# Patient Record
Sex: Male | Born: 1980 | Race: White | Hispanic: No | Marital: Married | Smoking: Never smoker
Health system: Southern US, Community
[De-identification: ages and names within clinical notes are randomized; demographics above are authoritative.]

## PROBLEM LIST (undated history)

## (undated) DIAGNOSIS — K219 Gastro-esophageal reflux disease without esophagitis: Secondary | ICD-10-CM

## (undated) DIAGNOSIS — J302 Other seasonal allergic rhinitis: Secondary | ICD-10-CM

## (undated) DIAGNOSIS — F329 Major depressive disorder, single episode, unspecified: Secondary | ICD-10-CM

## (undated) DIAGNOSIS — F32A Depression, unspecified: Secondary | ICD-10-CM

---
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
## 2008-10-17 ENCOUNTER — Ambulatory Visit: Payer: Self-pay | Admitting: Family Medicine

## 2008-10-17 DIAGNOSIS — R062 Wheezing: Secondary | ICD-10-CM

## 2008-10-17 DIAGNOSIS — D239 Other benign neoplasm of skin, unspecified: Secondary | ICD-10-CM | POA: Insufficient documentation

## 2008-10-22 ENCOUNTER — Encounter: Payer: Self-pay | Admitting: Family Medicine

## 2008-10-23 LAB — CONVERTED CEMR LAB
ALT: 21 units/L (ref 0–53)
AST: 21 units/L (ref 0–37)
Albumin: 4.6 g/dL (ref 3.5–5.2)
Alkaline Phosphatase: 58 units/L (ref 39–117)
BUN: 11 mg/dL (ref 6–23)
CO2: 26 meq/L (ref 19–32)
Calcium: 9.8 mg/dL (ref 8.4–10.5)
Chloride: 105 meq/L (ref 96–112)
Cholesterol: 185 mg/dL (ref 0–200)
Creatinine, Ser: 1.12 mg/dL (ref 0.40–1.50)
Glucose, Bld: 97 mg/dL (ref 70–99)
HDL: 36 mg/dL — ABNORMAL LOW (ref >39)
LDL Cholesterol: 128 mg/dL — ABNORMAL HIGH (ref 0–99)
Potassium: 4 meq/L (ref 3.5–5.3)
Sodium: 141 meq/L (ref 135–145)
Total Bilirubin: 0.5 mg/dL (ref 0.3–1.2)
Total CHOL/HDL Ratio: 5.1
Total Protein: 7.4 g/dL (ref 6.0–8.3)
Triglycerides: 105 mg/dL (ref <150)
VLDL: 21 mg/dL (ref 0–40)

## 2008-11-21 ENCOUNTER — Ambulatory Visit: Payer: Self-pay | Admitting: Family Medicine

## 2008-11-21 DIAGNOSIS — E785 Hyperlipidemia, unspecified: Secondary | ICD-10-CM

## 2009-05-14 ENCOUNTER — Encounter: Payer: Self-pay | Admitting: Family Medicine

## 2009-06-18 ENCOUNTER — Ambulatory Visit: Payer: Self-pay | Admitting: Family Medicine

## 2009-06-18 DIAGNOSIS — F329 Major depressive disorder, single episode, unspecified: Secondary | ICD-10-CM

## 2009-07-30 ENCOUNTER — Ambulatory Visit: Payer: Self-pay | Admitting: Family Medicine

## 2009-08-23 ENCOUNTER — Telehealth: Payer: Self-pay | Admitting: Family Medicine

## 2009-09-06 ENCOUNTER — Encounter: Payer: Self-pay | Admitting: Family Medicine

## 2010-07-15 NOTE — Progress Notes (Signed)
Summary: FYI  Phone Note Call from Patient   Caller: Patient Summary of Call: FYI- Pt is to start outpatient program at [facility]. Physc has already increased meds. Pt will cancel next apt w/ you and f/u after his progrma is complete. Initial call taken by: Payton Spark CMA,  August 23, 2009 3:24 PM  Follow-up for Phone Call        OK.   Follow-up by: Seymour Bars DO,  August 23, 2009 3:29 PM

## 2010-07-15 NOTE — Letter (Signed)
Summary: Roselie Skinner MSW [facility]  Roselie Skinner MSW LCSW [facility]   Imported By: Lanelle Bal 09/19/2009 12:08:20  _____________________________________________________________________  External Attachment:    Type:   Image     Comment:   External Document

## 2010-07-15 NOTE — Assessment & Plan Note (Signed)
Summary: depression   Vital Signs:  Patient profile:   30 year old male Height:      70 inches Weight:      193 pounds BMI:     27.79 O2 Sat:      97 % on Room air Temp:     98.0 degrees F oral Pulse rate:   84 / minute BP sitting:   131 / 75  (left arm) Cuff size:   regular  Vitals Entered By: Payton Spark CMA (June 18, 2009 3:50 PM)  O2 Flow:  Room air CC: ? Depression    Primary Care Provider:  Seymour Bars DO  CC:  ? Depression .  History of Present Illness: 30 yo WM presents for problems with stress for the past 6 months.  Wants to sleep more.  He has been under a lot of Work stress, financial stress, marrital stress over the past 6 mos.  They recently bought a house and now has to sell it.  He has anhedonia, poor energy, poor sleep.  Some suicidal ideations but no plan.  No known fam hx of personal hx of depression.  Has gained 17 lbs.  Had high cholesterol on Fire [employer] physical.  + fam hx of high cholesterol.  Not taking anything.    Current Medications (verified): 1)  Zyrtec Allergy 10 Mg Caps (Cetirizine Hcl) .... Take 1 Cap Daily As Needed  Allergies (verified): No Known Drug Allergies  Past History:  Past Medical History: Reviewed history from 11/21/2008 and no changes required. low HDL  Past Surgical History: Reviewed history from 10/17/2008 and no changes required. wisdom teeth removed  Social History: Reviewed history from 10/17/2008 and no changes required. 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 No kids. Exercises regularly.  In school at [employer]. Never smoked. 2 ETOH/ wk. 6 caffeine/ day.  Review of Systems Psych:  Complains of anxiety, depression, easily tearful, and irritability; denies alternate hallucination ( auditory/visual), panic attacks, suicidal thoughts/plans, thoughts of violence, unusual visions or sounds, and thoughts /plans of harming others.  Physical Exam  General:  alert,  well-developed, well-nourished, and well-hydrated.   Skin:  color normal.   Psych:  flat affect.     Impression & Recommendations:  Problem # 1:  DEPRESSION, INITIAL EPISODE (ICD-311)  PHQ-9 done.  Scored an 11 c/w moderate depression. Seeing a Teacher, music starting today. Has a good support system.  Agrees to start generic Zoloft daily.  Call if any worsening in mood. F/U in 6 wks for f/u with repeat PHQ-9.    Her updated medication list for this problem includes:    Sertraline Hcl 50 Mg Tabs (Sertraline hcl) .Marland Kitchen... 1/2 tab by mouth daily x 1 wk then 1 tab by mouth daily  Complete Medication List: 1)  Zyrtec Allergy 10 Mg Caps (Cetirizine hcl) .... Take 1 cap daily as needed 2)  Sertraline Hcl 50 Mg Tabs (Sertraline hcl) .... 1/2 tab by mouth daily x 1 wk then 1 tab by mouth daily 3)  Triamcinolone Acetonide 0.1 % Oint (Triamcinolone acetonide) .... Apply to rash two times a day x 10 days  Patient Instructions: 1)  Take Sertraline once daily for your mood. 2)  Continue regular counseling sessions. 3)  Call me with any problems. 4)  Return for f/u mood in 6 wks. Prescriptions: TRIAMCINOLONE ACETONIDE 0.1 % OINT (TRIAMCINOLONE ACETONIDE) apply to rash two times a day x 10 days  #15 g x 0  Entered and Authorized by:   Seymour Bars DO   Signed by:   Seymour Bars DO on 06/18/2009   Method used:   Electronically to        [facility] [phone]* (retail)       [address] [place], [place]  [address]       Ph: [phone] or [phone]       Fax: [phone]   RxID:   [id number] SERTRALINE HCL 50 MG TABS (SERTRALINE HCL) 1/2 tab by mouth daily x 1 wk then 1 tab by mouth daily  #30 x 1   Entered and Authorized by:   Seymour Bars DO   Signed by:   Seymour Bars DO on 06/18/2009   Method used:   [facility]ically to        [facility] [phone]* (retail)       [address] [place], [place]  [address]       Ph: [phone] or [phone]       Fax: [phone]    RxID:   [id number]   Appended Document: depression Append to phys exam: pink patch of plaque - like psoriasis on the lower back.  Will treat with RX steroid ointment x 10 days followed by use of Aquaphor.  Seymour Bars, D.O.

## 2010-07-15 NOTE — Letter (Signed)
Summary: Depression Questionnaire/[place] [facility]  Depression Questionnaire/[place] [facility]   Imported By: Lanelle Bal 08/02/2009 09:28:37  _____________________________________________________________________  External Attachment:    Type:   Image     Comment:   External Document

## 2010-07-15 NOTE — Assessment & Plan Note (Signed)
Summary: f/u mood   Vital Signs:  Patient profile:   30 year old male Height:      70 inches Weight:      184 pounds BMI:     26.50 O2 Sat:      99 % on Room air Temp:     97.7 degrees F oral Pulse rate:   61 / minute BP sitting:   141 / 78  (right arm) Cuff size:   regular  Vitals Entered By: Payton Spark CMA/april (July 30, 2009 4:00 PM)  O2 Flow:  Room air CC: F/U   Primary Care Provider:  Seymour Bars DO  CC:  F/U.  History of Present Illness: 30 yo WM presents for f/u depression.  He was started on Zoloft 6 wks ago.  He is up to 50 mg / day.  He notices small improvements.  Having vivid dreams.  Denies adverse SE.  His wife has has been supportive.  He is working on some of his life stressors.  Seeing a Veterinary surgeon.  He is still having fatigue, poor appetite, wt loss and anhedonia.  Denies anxiety, insomnia  or panic attacks.    Allergies: No Known Drug Allergies  Past History:  Past Medical History: low HDL depression  Social History: Reviewed history from 10/17/2008 and no changes required. 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  In school at [employer]. Never smoked. 2 ETOH/ wk. 6 caffeine/ day.  Review of Systems      See HPI  Physical Exam  General:  alert, well-developed, well-nourished, and well-hydrated.   Skin:  color normal.   Psych:  good eye contact, not anxious appearing, and depressed affect.     Impression & Recommendations:  Problem # 1:  DEPRESSION, INITIAL EPISODE (ICD-311) PHQ-9 improved from 11 to 10.  I will increase his Sertraline from 50 mg to 100 mg daily.  He is to continue counseling, relaxation and exercise. We discussed an intensive program for severe depression -- he is to call me if he thinks his depression is worsening. If not seeing much improvements in 2 wks, will add Wellbutrin.   His updated medication list for this problem includes:    Sertraline  Hcl 100 Mg Tabs (Sertraline hcl) .Marland Kitchen... 1 tab by mouth qpm  Complete Medication List: 1)  Zyrtec Allergy 10 Mg Caps (Cetirizine hcl) .... Take 1 cap daily as needed 2)  Sertraline Hcl 100 Mg Tabs (Sertraline hcl) .Marland Kitchen.. 1 tab by mouth qpm 3)  Triamcinolone Acetonide 0.1 % Oint (Triamcinolone acetonide) .... Apply to rash two times a day x 10 days  Patient Instructions: 1)  Increase Sertraline to 100 mg once daily (in the evening). 2)  Work on getting regular exercise, relaxation and counseling. 3)  Call if not seeing great improvements in 2 wks, will add Wellbutrin. 4)  Call me for any worsening in mood. 5)  F/U in 1 month. Prescriptions: SERTRALINE HCL 100 MG TABS (SERTRALINE HCL) 1 tab by mouth qPM  #30 x 2   Entered and Authorized by:   Seymour Bars DO   Signed by:   Seymour Bars DO on 07/30/2009   Method used:   Electronically to        [facility] [phone]* (retail)       [address] [place], [place]  [address]       Ph: [phone] or [phone]  Fax: [phone]   RxID:   [id number]   Laboratory Results

## 2011-06-28 ENCOUNTER — Emergency Department
Admission: EM | Admit: 2011-06-28 | Discharge: 2011-06-28 | Disposition: A | Discharge status: Home / Self Care | Payer: 59 | Source: Home / Self Care | Attending: Family Medicine | Admitting: Family Medicine

## 2011-06-28 DIAGNOSIS — J069 Acute upper respiratory infection, unspecified: Secondary | ICD-10-CM

## 2011-06-28 DIAGNOSIS — L989 Disorder of the skin and subcutaneous tissue, unspecified: Secondary | ICD-10-CM

## 2011-06-28 DIAGNOSIS — J029 Acute pharyngitis, unspecified: Secondary | ICD-10-CM

## 2011-06-28 HISTORY — DX: Major depressive disorder, single episode, unspecified: F32.9

## 2011-06-28 HISTORY — DX: Depression, unspecified: F32.A

## 2011-06-28 LAB — POCT RAPID STREP A (OFFICE): Rapid Strep A Screen: NEGATIVE

## 2011-06-28 MED ORDER — AMOXICILLIN 875 MG PO TABS: 875.0000 mg | ORAL_TABLET | Freq: Two times a day (BID) | ORAL | Status: AC

## 2011-06-28 MED ORDER — BENZONATATE 200 MG PO CAPS: 200.0000 mg | ORAL_CAPSULE | Freq: Every day | ORAL | Status: AC

## 2011-06-28 NOTE — ED Provider Notes (Signed)
History     CSN: [id number]  Arrival date & time 06/28/11  1246   First MD Initiated Contact with Patient 06/28/11 1609      Chief Complaint  Patient presents with  . Cough  . Sore Throat      HPI Comments: Patient complains of approximately 5 day history of gradually progressive URI symptoms beginning with a mild sore throat (now improved), followed by progressive nasal congestion.  A cough started about 3 days ago.  Complains of fatigue but no myalgias.  Cough is now worse at night and generally non-productive during the day.  There has been no pleuritic pain, shortness of breath, or wheezes.  He also complains of a nodule that has been on his upper back for about a year, slowly growing but nontender.  Patient is a 31 y.o. male presenting with cough and pharyngitis. The history is provided by the patient.  Cough This is a new problem. Episode onset: 5 days ago. The problem occurs hourly. The problem has been gradually worsening. The cough is productive of sputum. There has been no fever. Associated symptoms include rhinorrhea and sore throat. Pertinent negatives include no chest pain, no chills, no sweats, no ear congestion, no ear pain, no headaches, no myalgias, no shortness of breath, no wheezing and no eye redness. He has tried cough syrup for the symptoms. The treatment provided no relief. He is not a smoker.  Sore Throat Pertinent negatives include no chest pain, no headaches and no shortness of breath.    Past Medical History  Diagnosis Date  . Depression     History reviewed. No pertinent past surgical history.  History reviewed. No pertinent family history.  History  Substance Use Topics  . Smoking status: Never Smoker   . Smokeless tobacco: Not on file  . Alcohol Use: No      Review of Systems  Constitutional: Negative for chills.  HENT: Positive for sore throat and rhinorrhea. Negative for ear pain.   Eyes: Negative for redness.  Respiratory: Positive for  cough. Negative for shortness of breath and wheezing.   Cardiovascular: Negative for chest pain.  Musculoskeletal: Negative for myalgias.  Neurological: Negative for headaches.   + sore throat + cough No pleuritic pain No wheezing + nasal congestion + post-nasal drainage No sinus pain/pressure No itchy/red eyes No earache No hemoptysis No SOB No fever/chills No nausea No vomiting No abdominal pain No diarrhea No urinary symptoms No skin rashes + fatigue No myalgias No headache Used OTC meds without relief  Allergies  Review of patient's allergies indicates no known allergies.  Home Medications   Current Outpatient Rx  Name Route Sig Dispense Refill  . BUPROPION HCL 100 MG PO TABS Oral Take 100 mg by mouth 2 (two) times daily.    . VENLAFAXINE HCL 25 MG PO TABS Oral Take 25 mg by mouth 2 (two) times daily.    . AMOXICILLIN 875 MG PO TABS Oral Take 1 tablet (875 mg total) by mouth 2 (two) times daily. (Rx void after 07/06/11) 14 tablet 0  . BENZONATATE 200 MG PO CAPS Oral Take 1 capsule (200 mg total) by mouth at bedtime. Take as needed for cough 12 capsule 0    BP 116/84  Pulse 108  Temp(Src) 98.3 F (36.8 C) (Oral)  Resp 18  Ht 5\' 10"  (1.778 m)  Wt 191 lb (86.637 kg)  BMI 27.41 kg/m2  SpO2 97%  Physical Exam  Skin:  On the left upper back is a dome shaped oval firm raised lesion with rounded edges, measuring about 4mm by 8mm.  No tenderness.  Not fluctuant.   Nursing notes and Vital Signs reviewed. Appearance:  Patient appears healthy, stated age, and in no acute distress Eyes:  Pupils are equal, round, and reactive to light and accomodation.  Extraocular movement is intact.  Conjunctivae are not inflamed  Ears:  Canals normal.  Tympanic membranes normal.  Nose:  Mildly congested turbinates.  No sinus tenderness.   Pharynx:  Normal Neck:  Supple.  Slightly tender shotty anterior/posterior nodes are palpated bilaterally  Lungs:  Clear to  auscultation.  Breath sounds are equal.  Heart:  Regular rate and rhythm without murmurs, rubs, or gallops.  Abdomen:  Nontender without masses or hepatosplenomegaly.  Bowel sounds are present.  No CVA or flank tenderness.  Extremities:  No edema.  No calf tenderness   ED Course  Procedures none   Labs Reviewed  POCT RAPID STREP A (OFFICE) negative      1. Acute upper respiratory infections of unspecified site   2. Acute pharyngitis   3. Skin lesion of back, uncertain etiology      MDM  There is no evidence of bacterial infection today.   Treat symptomatically for now  Take Mucinex D (guaifenesin with decongestant) twice daily for congestion.  Increase fluid intake, rest. May use Afrin nasal spray (or generic oxymetazoline) twice daily for about 5 days.  Also recommend using saline nasal spray several times daily and saline nasal irrigation. Stop all antihistamines for now, and other non-prescription cough/cold preparations. Begin Amoxicillin if not improving about 5 days or if persistent fever develops (given Rx to hold) Follow-up with family doctor if not improving 7 to 10 days. Follow-up with family doctor, dermatologist, or plastic surgeon for biopsy of lesion on back         Donna Christen, MD 06/28/11 1657

## 2011-06-28 NOTE — ED Notes (Signed)
States symptoms started Tuesday and wife request since they had to wait so long we can look at raised area to left side of upper back which has been there for over a year.  Area appears to be pus filled and redden

## 2017-12-24 ENCOUNTER — Other Ambulatory Visit: Payer: Self-pay | Admitting: Nurse Practitioner

## 2017-12-24 ENCOUNTER — Ambulatory Visit
Admission: RE | Admit: 2017-12-24 | Discharge: 2017-12-24 | Disposition: A | Discharge status: Home / Self Care | Payer: No Typology Code available for payment source | Source: Ambulatory Visit | Attending: Nurse Practitioner | Admitting: Nurse Practitioner

## 2017-12-24 DIAGNOSIS — Z021 Encounter for pre-employment examination: Secondary | ICD-10-CM

## 2020-02-01 IMAGING — CR DG CHEST 1V
1 series · 1 of 1 positions shown · non-contrast
Comparison: None.

CLINICAL DATA: Pre-employment physical examination

EXAM:
CHEST  1 VIEW

[w chest pa]
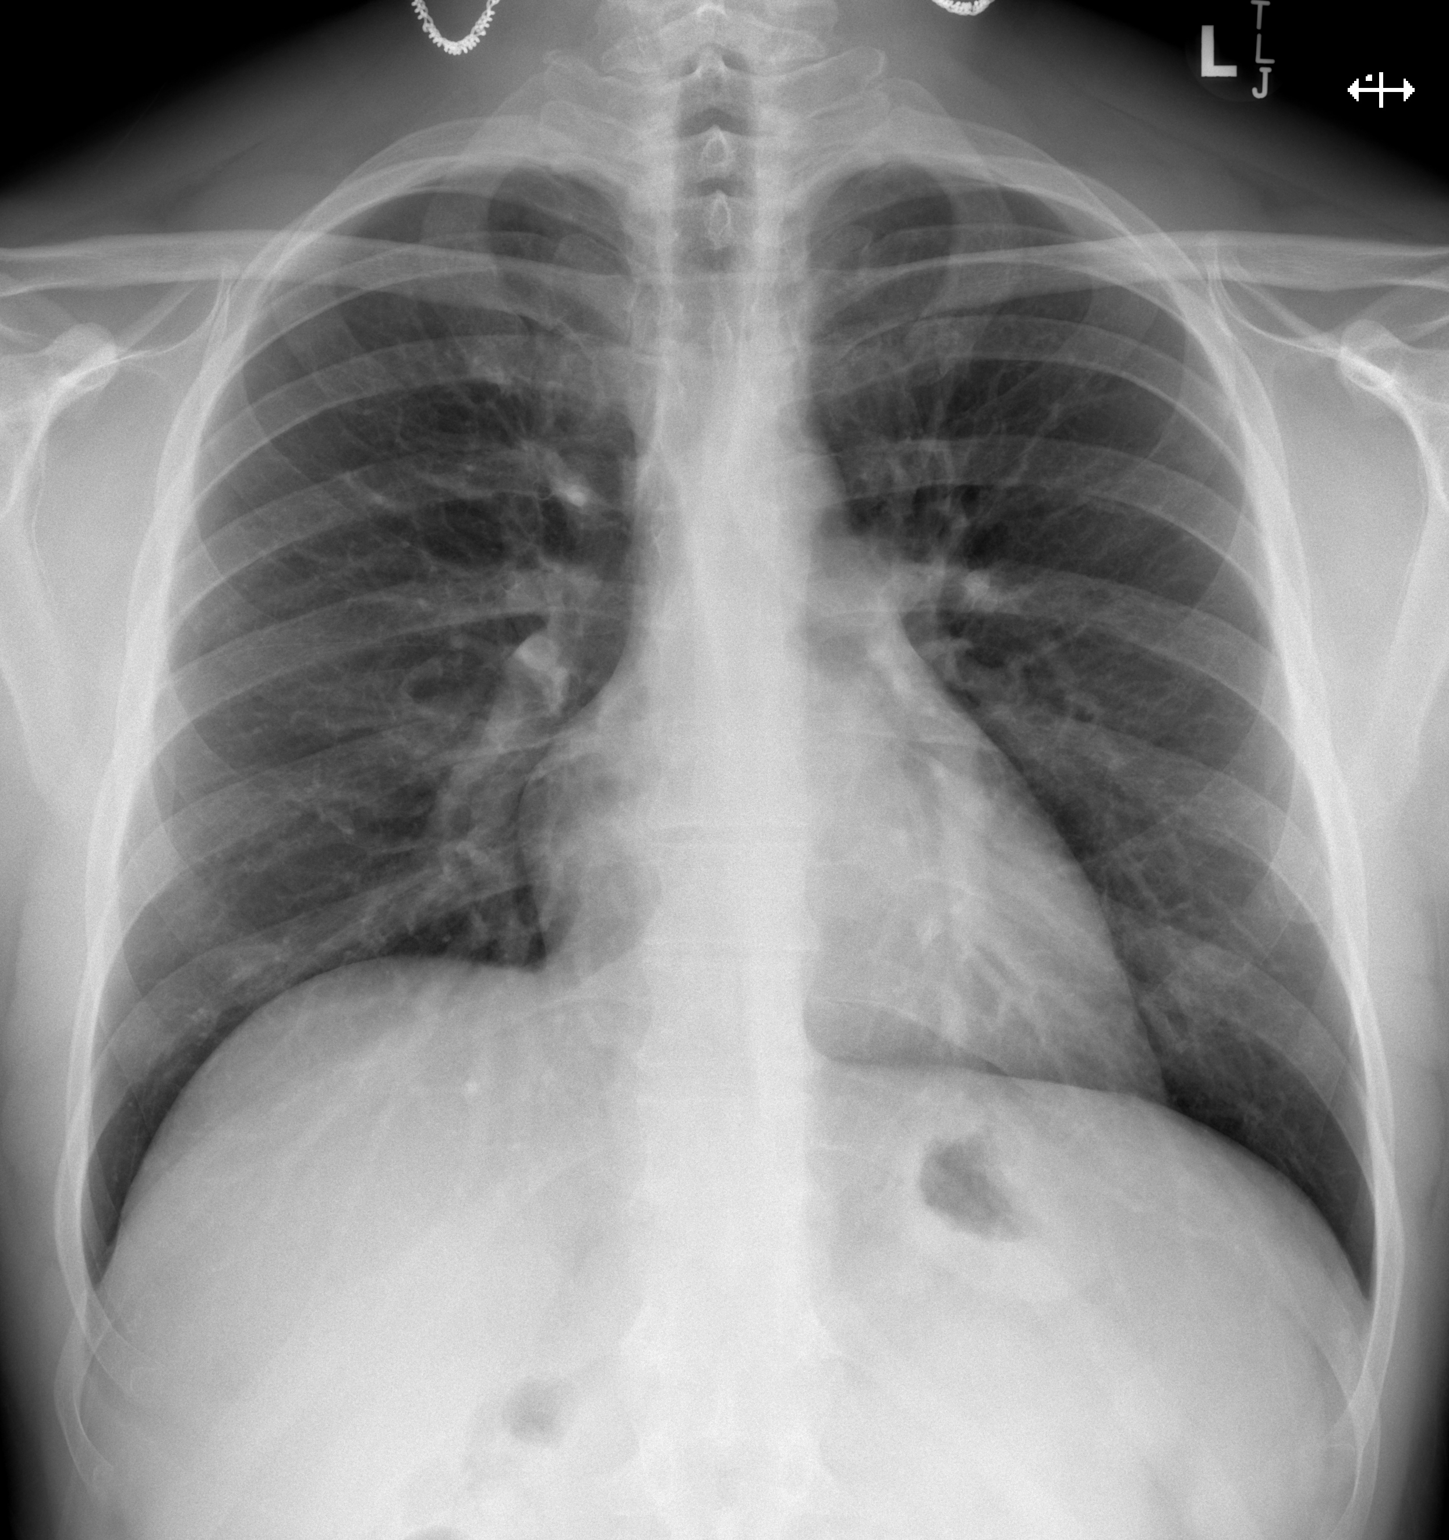

[1 of 1 positions shown; findings below may reference images not displayed]

FINDINGS: Lungs are clear. Heart size and pulmonary vascularity are normal. No
adenopathy. No bone lesions.
IMPRESSION: No abnormality noted.

## 2020-10-16 ENCOUNTER — Other Ambulatory Visit: Payer: Self-pay

## 2020-10-16 ENCOUNTER — Emergency Department
Admission: EM | Admit: 2020-10-16 | Discharge: 2020-10-16 | Disposition: A | Discharge status: Home / Self Care | Payer: 59 | Source: Home / Self Care

## 2020-10-16 ENCOUNTER — Encounter: Payer: Self-pay | Admitting: *Deleted

## 2020-10-16 DIAGNOSIS — M25562 Pain in left knee: Secondary | ICD-10-CM | POA: Diagnosis not present

## 2020-10-16 DIAGNOSIS — S8392XA Sprain of unspecified site of left knee, initial encounter: Secondary | ICD-10-CM

## 2020-10-16 HISTORY — DX: Gastro-esophageal reflux disease without esophagitis: K21.9

## 2020-10-16 HISTORY — DX: Other seasonal allergic rhinitis: J30.2

## 2020-10-16 MED ORDER — MELOXICAM 15 MG PO TABS: 15.0000 mg | ORAL_TABLET | Freq: Every day | ORAL | 0 refills | Status: AC

## 2020-10-16 NOTE — ED Provider Notes (Signed)
[facility]   [id number] 10/16/20 Arrival Time: 1540  GQ:QPYPP PAIN  SUBJECTIVE: History from: patient. Timothy Berger is a 40 y.o. male complains of L knee pain that began about 1 week ago. States that he has been taking tylenol with little relief. Denies a precipitating event or specific injury. Localizes the pain to the medial L knee. Describes the pain as intermittent and achy in character. Has tried OTC medications without relief. Symptoms are made worse with activity.  Denies similar symptoms in the past. Denies fever, chills, erythema, ecchymosis, weakness, numbness and tingling, saddle paresthesias, loss of bowel or bladder function.      ROS: As per HPI.  All other pertinent ROS negative.     Past Medical History:  Diagnosis Date  . Depression   . GERD (gastroesophageal reflux disease)   . Seasonal allergies    History reviewed. No pertinent surgical history. No Known Allergies No current facility-administered medications on file prior to encounter.   Current Outpatient Medications on File Prior to Encounter  Medication Sig Dispense Refill  . omeprazole (PRILOSEC) 40 MG capsule Take 40 mg by mouth daily.    . cetirizine (ZYRTEC) 10 MG tablet Take by mouth.     Social History   Socioeconomic History  . Marital status: Married    Spouse name: Not on file  . Number of children: Not on file  . Years of education: Not on file  . Highest education level: Not on file  Occupational History  . Not on file  Tobacco Use  . Smoking status: Never Smoker  . Smokeless tobacco: Never Used  Vaping Use  . Vaping Use: Never used  Substance and Sexual Activity  . Alcohol use: No  . Drug use: No  . Sexual activity: Not on file  Other Topics Concern  . Not on file  Social History Narrative  . Not on file   Social Determinants of Health   Financial Resource Strain: Not on file  Food Insecurity: Not on file  Transportation Needs: Not on file  Physical Activity:  Not on file  Stress: Not on file  Social Connections: Not on file  Intimate Partner Violence: Not on file   History reviewed. No pertinent family history.  OBJECTIVE:  Vitals:   10/16/20 0815 10/16/20 0818  BP:  132/87  Pulse:  73  Resp:  18  Temp:  98.7 F (37.1 C)  TempSrc:  Oral  SpO2:  97%  Weight: 205 lb (93 kg)   Height: 5\' 10"  (1.778 m)     General appearance: ALERT; in no acute distress.  Head: NCAT Lungs: Normal respiratory effort CV: pulses 2+ bilaterally. Cap refill < 2 seconds Musculoskeletal:  Inspection: Skin warm, dry, clear and intact Effusion to medial aspect of L knee Palpation: Non tender to palpation JKD:TOIZ active and passive to L knee Skin: warm and dry Neurologic: Ambulates without difficulty; Sensation intact about the upper/ lower extremities Psychological: alert and cooperative; normal mood and affect  DIAGNOSTIC STUDIES:  No results found.   ASSESSMENT & PLAN:  1. Acute pain of left knee   2. Sprain of left knee, unspecified ligament, initial encounter      Meds ordered this encounter  Medications  . meloxicam (MOBIC) 15 MG tablet    Sig: Take 1 tablet (15 mg total) by mouth daily.    Dispense:  30 tablet    Refill:  0    Order Specific Question:   Supervising Provider  AnswerChase Picket [[id number]]    Continue conservative management of rest, ice, and gentle stretches Take ibuprofen as needed for pain relief (may cause abdominal discomfort, ulcers, and GI bleeds avoid taking with other NSAIDs) Follow up with sports medicine if symptoms persist Return or go to the ER if you have any new or worsening symptoms (fever, chills, chest pain, abdominal pain, changes in bowel or bladder habits, pain radiating into lower legs)   Reviewed expectations re: course of current medical issues. Questions answered. Outlined signs and symptoms indicating need for more acute intervention. Patient verbalized understanding. After Visit  Summary given.       Faustino Congress, NP 10/16/20 [phone]

## 2020-10-16 NOTE — Discharge Instructions (Addendum)
I have prescribed Meloxicam for you to take daily. Do not take ibuprofen with this medication. You may take tylenol with this.  Wear the brace when active and for comfort  May use ice to the area  Follow up with this office or with primary care if symptoms are persisting.  Follow up in the ER for high fever, trouble swallowing, trouble breathing, other concerning symptoms.

## 2020-10-16 NOTE — ED Triage Notes (Signed)
Pt c/o LT knee pain and swelling x 1wk. Denies injury. Taking Tylenol prn.

## 2024-01-19 ENCOUNTER — Other Ambulatory Visit (HOSPITAL_BASED_OUTPATIENT_CLINIC_OR_DEPARTMENT_OTHER): Payer: Self-pay | Admitting: Family Medicine

## 2024-01-19 DIAGNOSIS — Z8249 Family history of ischemic heart disease and other diseases of the circulatory system: Secondary | ICD-10-CM

## 2024-01-25 ENCOUNTER — Ambulatory Visit (HOSPITAL_BASED_OUTPATIENT_CLINIC_OR_DEPARTMENT_OTHER)
Admission: RE | Admit: 2024-01-25 | Discharge: 2024-01-25 | Disposition: A | Discharge status: Home / Self Care | Payer: Self-pay | Source: Ambulatory Visit | Attending: Family Medicine | Admitting: Family Medicine

## 2024-01-25 DIAGNOSIS — Z8249 Family history of ischemic heart disease and other diseases of the circulatory system: Secondary | ICD-10-CM
